# Patient Record
Sex: Female | Born: 1965 | Race: White | Hispanic: No | Marital: Married
Health system: Southern US, Community
[De-identification: ages and names within clinical notes are randomized; demographics above are authoritative.]

## PROBLEM LIST (undated history)

## (undated) DIAGNOSIS — K219 Gastro-esophageal reflux disease without esophagitis: Secondary | ICD-10-CM

## (undated) DIAGNOSIS — R748 Abnormal levels of other serum enzymes: Secondary | ICD-10-CM

## (undated) DIAGNOSIS — R7401 Elevation of levels of liver transaminase levels: Secondary | ICD-10-CM

## (undated) DIAGNOSIS — R740 Nonspecific elevation of levels of transaminase and lactic acid dehydrogenase [LDH]: Secondary | ICD-10-CM

## (undated) DIAGNOSIS — Z720 Tobacco use: Secondary | ICD-10-CM

## (undated) DIAGNOSIS — E876 Hypokalemia: Secondary | ICD-10-CM

## (undated) DIAGNOSIS — R0600 Dyspnea, unspecified: Secondary | ICD-10-CM

## (undated) DIAGNOSIS — E871 Hypo-osmolality and hyponatremia: Secondary | ICD-10-CM

## (undated) DIAGNOSIS — F1010 Alcohol abuse, uncomplicated: Secondary | ICD-10-CM

## (undated) DIAGNOSIS — R569 Unspecified convulsions: Secondary | ICD-10-CM

---

## 2017-09-07 ENCOUNTER — Inpatient Hospital Stay (HOSPITAL_COMMUNITY)
Admission: AD | Admit: 2017-09-07 | Discharge: 2017-09-09 | DRG: 101 | Disposition: A | Discharge status: Home / Self Care | Payer: BLUE CROSS/BLUE SHIELD | Source: Other Acute Inpatient Hospital | Attending: Internal Medicine | Admitting: Internal Medicine

## 2017-09-07 ENCOUNTER — Observation Stay (HOSPITAL_COMMUNITY): Payer: BLUE CROSS/BLUE SHIELD

## 2017-09-07 ENCOUNTER — Encounter (HOSPITAL_COMMUNITY): Payer: Self-pay | Admitting: Internal Medicine

## 2017-09-07 DIAGNOSIS — F1020 Alcohol dependence, uncomplicated: Secondary | ICD-10-CM | POA: Diagnosis present

## 2017-09-07 DIAGNOSIS — K7010 Alcoholic hepatitis without ascites: Secondary | ICD-10-CM | POA: Diagnosis present

## 2017-09-07 DIAGNOSIS — Z7951 Long term (current) use of inhaled steroids: Secondary | ICD-10-CM

## 2017-09-07 DIAGNOSIS — Z720 Tobacco use: Secondary | ICD-10-CM | POA: Diagnosis present

## 2017-09-07 DIAGNOSIS — M6282 Rhabdomyolysis: Secondary | ICD-10-CM | POA: Diagnosis present

## 2017-09-07 DIAGNOSIS — Z882 Allergy status to sulfonamides status: Secondary | ICD-10-CM

## 2017-09-07 DIAGNOSIS — R740 Nonspecific elevation of levels of transaminase and lactic acid dehydrogenase [LDH]: Secondary | ICD-10-CM

## 2017-09-07 DIAGNOSIS — E871 Hypo-osmolality and hyponatremia: Secondary | ICD-10-CM | POA: Diagnosis present

## 2017-09-07 DIAGNOSIS — F1010 Alcohol abuse, uncomplicated: Secondary | ICD-10-CM | POA: Diagnosis present

## 2017-09-07 DIAGNOSIS — F329 Major depressive disorder, single episode, unspecified: Secondary | ICD-10-CM | POA: Diagnosis present

## 2017-09-07 DIAGNOSIS — F17200 Nicotine dependence, unspecified, uncomplicated: Secondary | ICD-10-CM | POA: Diagnosis present

## 2017-09-07 DIAGNOSIS — E876 Hypokalemia: Secondary | ICD-10-CM | POA: Diagnosis present

## 2017-09-07 DIAGNOSIS — R0600 Dyspnea, unspecified: Secondary | ICD-10-CM | POA: Diagnosis present

## 2017-09-07 DIAGNOSIS — Y901 Blood alcohol level of 20-39 mg/100 ml: Secondary | ICD-10-CM | POA: Diagnosis present

## 2017-09-07 DIAGNOSIS — R569 Unspecified convulsions: Secondary | ICD-10-CM | POA: Diagnosis not present

## 2017-09-07 DIAGNOSIS — K219 Gastro-esophageal reflux disease without esophagitis: Secondary | ICD-10-CM | POA: Diagnosis present

## 2017-09-07 DIAGNOSIS — R748 Abnormal levels of other serum enzymes: Secondary | ICD-10-CM | POA: Diagnosis present

## 2017-09-07 DIAGNOSIS — R7401 Elevation of levels of liver transaminase levels: Secondary | ICD-10-CM | POA: Diagnosis present

## 2017-09-07 DIAGNOSIS — J45901 Unspecified asthma with (acute) exacerbation: Secondary | ICD-10-CM

## 2017-09-07 DIAGNOSIS — E872 Acidosis: Secondary | ICD-10-CM | POA: Diagnosis present

## 2017-09-07 HISTORY — DX: Gastro-esophageal reflux disease without esophagitis: K21.9

## 2017-09-07 HISTORY — DX: Elevation of levels of liver transaminase levels: R74.01

## 2017-09-07 HISTORY — DX: Tobacco use: Z72.0

## 2017-09-07 HISTORY — DX: Unspecified convulsions: R56.9

## 2017-09-07 HISTORY — DX: Nonspecific elevation of levels of transaminase and lactic acid dehydrogenase (ldh): R74.0

## 2017-09-07 HISTORY — DX: Alcohol abuse, uncomplicated: F10.10

## 2017-09-07 HISTORY — DX: Dyspnea, unspecified: R06.00

## 2017-09-07 HISTORY — DX: Hypokalemia: E87.6

## 2017-09-07 HISTORY — DX: Abnormal levels of other serum enzymes: R74.8

## 2017-09-07 HISTORY — DX: Hypo-osmolality and hyponatremia: E87.1

## 2017-09-07 LAB — COMPREHENSIVE METABOLIC PANEL
ALBUMIN: 2.6 g/dL — AB (ref 3.5–5.0)
ALK PHOS: 151 U/L — AB (ref 38–126)
ALT: 33 U/L (ref 14–54)
AST: 73 U/L — AB (ref 15–41)
Anion gap: 12 (ref 5–15)
BILIRUBIN TOTAL: 2.9 mg/dL — AB (ref 0.3–1.2)
CALCIUM: 7.9 mg/dL — AB (ref 8.9–10.3)
CO2: 25 mmol/L (ref 22–32)
CREATININE: 0.81 mg/dL (ref 0.44–1.00)
Chloride: 103 mmol/L (ref 101–111)
GFR calc Af Amer: >60 mL/min (ref >60)
GLUCOSE: 93 mg/dL (ref 65–99)
POTASSIUM: 2.8 mmol/L — AB (ref 3.5–5.1)
Sodium: 140 mmol/L (ref 135–145)
TOTAL PROTEIN: 4.7 g/dL — AB (ref 6.5–8.1)

## 2017-09-07 LAB — MAGNESIUM: MAGNESIUM: 2 mg/dL (ref 1.7–2.4)

## 2017-09-07 LAB — CBC
HCT: 34.4 % — ABNORMAL LOW (ref 36.0–46.0)
HEMOGLOBIN: 11.6 g/dL — AB (ref 12.0–15.0)
MCH: 35.9 pg — AB (ref 26.0–34.0)
MCHC: 33.7 g/dL (ref 30.0–36.0)
MCV: 106.5 fL — ABNORMAL HIGH (ref 78.0–100.0)
Platelets: 235 10*3/uL (ref 150–400)
RBC: 3.23 MIL/uL — ABNORMAL LOW (ref 3.87–5.11)
RDW: 15.7 % — ABNORMAL HIGH (ref 11.5–15.5)
WBC: 9.2 10*3/uL (ref 4.0–10.5)

## 2017-09-07 LAB — ETHANOL

## 2017-09-07 LAB — GLUCOSE, CAPILLARY: Glucose-Capillary: 105 mg/dL — ABNORMAL HIGH (ref 65–99)

## 2017-09-07 LAB — CK: Total CK: 419 U/L — ABNORMAL HIGH (ref 38–234)

## 2017-09-07 MED ORDER — IPRATROPIUM-ALBUTEROL 0.5-2.5 (3) MG/3ML IN SOLN
3.0000 mL | RESPIRATORY_TRACT | Status: AC
  Administered 2017-09-07 (×3): 3 mL via RESPIRATORY_TRACT
  Filled 2017-09-07 (×5): qty 3

## 2017-09-07 MED ORDER — LORAZEPAM 1 MG PO TABS: 1.0000 mg | ORAL_TABLET | Freq: Four times a day (QID) | ORAL | Status: DC | PRN

## 2017-09-07 MED ORDER — ADULT MULTIVITAMIN W/MINERALS CH
1.0000 | ORAL_TABLET | Freq: Every day | ORAL | Status: DC
  Administered 2017-09-07 – 2017-09-09 (×3): 1 via ORAL
  Filled 2017-09-07 (×3): qty 1

## 2017-09-07 MED ORDER — ACETAMINOPHEN 325 MG PO TABS
650.0000 mg | ORAL_TABLET | Freq: Four times a day (QID) | ORAL | Status: DC | PRN
  Administered 2017-09-07 – 2017-09-09 (×2): 650 mg via ORAL
  Filled 2017-09-07 (×2): qty 2

## 2017-09-07 MED ORDER — HYDROCODONE-ACETAMINOPHEN 5-325 MG PO TABS: 1.0000 | ORAL_TABLET | ORAL | Status: DC | PRN

## 2017-09-07 MED ORDER — LORAZEPAM 2 MG/ML IJ SOLN: 1.0000 mg | Freq: Four times a day (QID) | INTRAMUSCULAR | Status: DC | PRN

## 2017-09-07 MED ORDER — FOLIC ACID 1 MG PO TABS
1.0000 mg | ORAL_TABLET | Freq: Every day | ORAL | Status: DC
  Administered 2017-09-07 – 2017-09-09 (×3): 1 mg via ORAL
  Filled 2017-09-07 (×3): qty 1

## 2017-09-07 MED ORDER — VITAMIN B-1 100 MG PO TABS
100.0000 mg | ORAL_TABLET | Freq: Every day | ORAL | Status: DC
  Administered 2017-09-07 – 2017-09-09 (×3): 100 mg via ORAL
  Filled 2017-09-07 (×3): qty 1

## 2017-09-07 MED ORDER — SODIUM CHLORIDE 0.9 % IV SOLN
INTRAVENOUS | Status: AC
  Administered 2017-09-07 (×2): via INTRAVENOUS

## 2017-09-07 MED ORDER — SENNOSIDES-DOCUSATE SODIUM 8.6-50 MG PO TABS: 1.0000 | ORAL_TABLET | Freq: Every evening | ORAL | Status: DC | PRN

## 2017-09-07 MED ORDER — ACETAMINOPHEN 650 MG RE SUPP: 650.0000 mg | Freq: Four times a day (QID) | RECTAL | Status: DC | PRN

## 2017-09-07 MED ORDER — CITALOPRAM HYDROBROMIDE 40 MG PO TABS
40.0000 mg | ORAL_TABLET | Freq: Every day | ORAL | Status: DC
  Administered 2017-09-07 – 2017-09-09 (×3): 40 mg via ORAL
  Filled 2017-09-07 (×2): qty 1

## 2017-09-07 MED ORDER — POTASSIUM CHLORIDE CRYS ER 20 MEQ PO TBCR
40.0000 meq | EXTENDED_RELEASE_TABLET | Freq: Two times a day (BID) | ORAL | Status: AC
  Administered 2017-09-07 (×2): 40 meq via ORAL
  Filled 2017-09-07 (×2): qty 2

## 2017-09-07 MED ORDER — LIDOCAINE VISCOUS 2 % MT SOLN
15.0000 mL | Freq: Four times a day (QID) | OROMUCOSAL | Status: DC | PRN
  Filled 2017-09-07: qty 15

## 2017-09-07 MED ORDER — LEVETIRACETAM 500 MG PO TABS
500.0000 mg | ORAL_TABLET | Freq: Two times a day (BID) | ORAL | Status: DC
  Administered 2017-09-07 – 2017-09-09 (×5): 500 mg via ORAL
  Filled 2017-09-07 (×5): qty 1

## 2017-09-07 MED ORDER — LORAZEPAM 2 MG/ML IJ SOLN: 1.0000 mg | INTRAMUSCULAR | Status: DC | PRN

## 2017-09-07 MED ORDER — GUAIFENESIN ER 600 MG PO TB12
600.0000 mg | ORAL_TABLET | Freq: Two times a day (BID) | ORAL | Status: DC
  Administered 2017-09-07 – 2017-09-09 (×5): 600 mg via ORAL
  Filled 2017-09-07 (×5): qty 1

## 2017-09-07 MED ORDER — ONDANSETRON HCL 4 MG/2ML IJ SOLN: 4.0000 mg | Freq: Four times a day (QID) | INTRAMUSCULAR | Status: DC | PRN

## 2017-09-07 MED ORDER — TRAZODONE HCL 50 MG PO TABS: 25.0000 mg | ORAL_TABLET | Freq: Every evening | ORAL | Status: DC | PRN

## 2017-09-07 MED ORDER — THIAMINE HCL 100 MG/ML IJ SOLN
100.0000 mg | Freq: Every day | INTRAMUSCULAR | Status: DC
  Filled 2017-09-07: qty 2

## 2017-09-07 MED ORDER — ENOXAPARIN SODIUM 40 MG/0.4ML ~~LOC~~ SOLN
40.0000 mg | Freq: Every day | SUBCUTANEOUS | Status: DC
  Administered 2017-09-07 – 2017-09-09 (×3): 40 mg via SUBCUTANEOUS
  Filled 2017-09-07 (×3): qty 0.4

## 2017-09-07 MED ORDER — ONDANSETRON HCL 4 MG PO TABS: 4.0000 mg | ORAL_TABLET | Freq: Four times a day (QID) | ORAL | Status: DC | PRN

## 2017-09-07 NOTE — Progress Notes (Addendum)
Pt resting did not want to eat dinner d/t touch pain. Asked pt if she wanted something softer to eat or drink pt refused at this time. Pt very sleepy but arousable.

## 2017-09-07 NOTE — Progress Notes (Signed)
EEG at bedside.

## 2017-09-07 NOTE — Progress Notes (Signed)
Spoke with pts sister per pt request. Updates given, emergency contact numbers placed in chart. Pt resting did not want to speak with sister at this time.

## 2017-09-07 NOTE — Progress Notes (Signed)
Received report from transferring facility ER RN  Pollyann KennedySteve Pack that,  [facility].

## 2017-09-07 NOTE — Procedures (Signed)
History: 52 year old female being evaluated for new onset seizures  Sedation: None  Technique: This is a 21 channel routine scalp EEG performed at the bedside with bipolar and monopolar montages arranged in accordance to the international 10/20 system of electrode placement. One channel was dedicated to EKG recording.    Background: The background consists of intermixed alpha and beta activities. There is a well defined posterior dominant rhythm of 12 Hz that attenuates with eye opening. Sleep is recorded with normal appearing structures.  There are positive occipital sharp transients of sleep (POSTS) which are a normal variant.  Photic stimulation: Physiologic driving is not performed  EEG Abnormalities: None  Clinical Interpretation: This normal EEG is recorded in the waking and sleep. state. There was no seizure or seizure predisposition recorded on this study. Please note that a normal EEG does not preclude the possibility of epilepsy.   Theresa SlotMcNeill Devaris Quirk, MD [facility] [phone]  If 7pm- 7am, please page neurology on call as listed in AMION.

## 2017-09-07 NOTE — H&P (Signed)
History and Physical    Theresa Gray PPI:[id number] DOB: [birth year] DOA: 09/07/2017  PCP: Joyice Faster, FNP Patient coming from: home  Chief Complaint: seizure  HPI: Theresa Gray is a 52 y.o. female with medical history significant gerd, etoh abuse, tobacco use presents to [facility] room 18 from [place] complaint of seizures. She'll evaluation reveals hypokalemia hyponatremia  elevated transaminase elevated CK. [facility] are asked to admit  Formation is obtained from the patient noting information may be unreliable as she remains somewhat lethargic. Reportedly she [place] friends [place] the [place] as she used to work [place] and she had a grand mal seizure lasted 1 minute. Chart review indicates afterwards she was alert and oriented. No report of any loss of bladder function. Patient reports prior to that event she had to "brief episodes where could not talk". EMS was called she was transported to [facility] in [place].Chart review indicates once she got to emergency department she experienced two episodes of seizure activity without regaining consciousness. Chart review indicates at that time airway managed with NRB mask and she received ativan. She was then postictal.  She denies any recent illness. 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 Denies any fever chills nausea vomiting. She denies headache dizziness syncope or near-syncope. He denies dysuria hematuria frequency or urgency.    ED Course: In the ED a dental she is provided with thousand milligrams of Keppra and Ativan. Upon admission she is afebrile hemodynamically stable and not hypoxic.  Review of Systems: As per HPI otherwise all other systems reviewed and are negative.   Ambulatory Status: Ambulates independently is independent with ADLs  Past Medical History:  Diagnosis Date  . Dyspnea   . Elevated CK     . Elevated transaminase level   . ETOH abuse   . GERD (gastroesophageal reflux disease)   . Hypokalemia   . Hyponatremia   . Seizure ([place])   . Tobacco use       Social History   Socioeconomic History  . Marital status: Married    Spouse name: Not on file  . Number of children: Not on file  . Years of education: Not on file  . Highest education level: Not on file  Social Needs  . Financial resource strain: Not on file  . Food insecurity - worry: Not on file  . Food insecurity - inability: Not on file  . Transportation needs - medical: Not on file  . Transportation needs - non-medical: Not on file  Occupational History  . Not on file  Tobacco Use  . Smoking status: Not on file  Substance and Sexual Activity  . Alcohol use: Not on file  . Drug use: Not on file  . Sexual activity: Not on file  Other Topics Concern  . Not on file  Social History Narrative  . Not on file    Allergies  Allergen Reactions  . Sulfa Antibiotics     No family history on file.  Prior to Admission medications   Not on File    Physical Exam: Vitals:   09/07/17 0815  BP: 116/77  Pulse: 77  Resp: 20  Temp: 98.2 F (36.8 C)  TempSrc: Oral  SpO2: 99%     General:  Appears lethargic arousable to verbal stimuli much older than her stated age in no acute distress Eyes:  PERRL, EOMI, normal lids,  iris ENT:  grossly normal hearing, dried blood noted inside of lower lip. Right side of tongue was swelling erythema Neck:  no LAD, masses or thyromegaly Cardiovascular:  RRR, no m/r/g. No LE edema.  Respiratory:  Increased work of breathing breath sounds quite coarse throughout all of her wheeze frequent cough during exam Abdomen:  soft, ntnd, positive bowel sounds throughout no guarding or rebounding Skin:  no rash or induration seen on limited exam Musculoskeletal:  grossly normal tone BUE/BLE, good ROM, no bony abnormality Psychiatric:  grossly normal mood and affect, speech fluent and  appropriate, AOx3 Neurologic:  CN 2-12 grossly intact, moves all extremities in coordinated fashion, sensation intact lethargic oriented to self and place follow simple commands moving all extremities spontaneously   Labs on Admission: I have personally reviewed following labs and imaging studies  CBC: No results for input(s): WBC, NEUTROABS, HGB, HCT, MCV, PLT in the last 168 hours. Basic Metabolic Panel: No results for input(s): NA, K, CL, CO2, GLUCOSE, BUN, CREATININE, CALCIUM, MG, PHOS in the last 168 hours. GFR: CrCl cannot be calculated (No order found.). Liver Function Tests: No results for input(s): AST, ALT, ALKPHOS, BILITOT, PROT, ALBUMIN in the last 168 hours. No results for input(s): LIPASE, AMYLASE in the last 168 hours. No results for input(s): AMMONIA in the last 168 hours. Coagulation Profile: No results for input(s): INR, PROTIME in the last 168 hours. Cardiac Enzymes: No results for input(s): CKTOTAL, CKMB, CKMBINDEX, TROPONINI in the last 168 hours. BNP (last 3 results) No results for input(s): PROBNP in the last 8760 hours. HbA1C: No results for input(s): HGBA1C in the last 72 hours. CBG: Recent Labs  Lab 09/07/17 0816  GLUCAP 105*   Lipid Profile: No results for input(s): CHOL, HDL, LDLCALC, TRIG, CHOLHDL, LDLDIRECT in the last 72 hours. Thyroid Function Tests: No results for input(s): TSH, T4TOTAL, FREET4, T3FREE, THYROIDAB in the last 72 hours. Anemia Panel: No results for input(s): VITAMINB12, FOLATE, FERRITIN, TIBC, IRON, RETICCTPCT in the last 72 hours. Urine analysis: No results found for: COLORURINE, APPEARANCEUR, LABSPEC, PHURINE, GLUCOSEU, HGBUR, BILIRUBINUR, KETONESUR, PROTEINUR, UROBILINOGEN, NITRITE, LEUKOCYTESUR  Creatinine Clearance: CrCl cannot be calculated (No order found.).  Sepsis Labs: @LABRCNTIP (procalcitonin:4,lacticidven:4) )No results found for this or any previous visit (from the past 240 hour(s)).   Radiological Exams on  Admission: No results found.  EKG:   Assessment/Plan Principal Problem:   Seizure (HCC) Active Problems:   GERD (gastroesophageal reflux disease)   Tobacco use   Elevated transaminase level   Hypokalemia   Hyponatremia   Elevated CK   ETOH abuse   Dyspnea   1.Seizure. No history of same. Etiology uncertain but concern for EtOH withdrawal she indicates she was not consuming as much alcohol as she normally does. CT head at [facility] negative. UDS unremarkable. ETOH level 0.030 -Admit to telemetry -EEG -CBC, CMET, CK -Continue Keppra -When necessary Ativan -Evaluate home medications once home meds determined by pharmacy -Seizure precautions  #2.dyspnea/wheezing. Likely COPD as she is a smoker. Her home medications do include an inhaler. She continues to smoke. Is not on home oxygen. She is unsure if she's ever had pulmonary function test. -Scheduled nebulizers -chest xray as some concern for aspiration of blood during seizure -anti-tussive -oxygen supplemenatation as indicated -monitor her oxygen saturation level -May benefit from outpatient pulmonary function tests  #3.elevated transaminase. Alk phos 208, ast 78, total bili 1.6 Likely related to EtOH abuse. She reports she drinks 1 40oz hard cider daily. etoh level in ED 0.030. -  iv fluids -cmet -cessation counseling -OP follow up  #4. EtOH abuse. See above -CIWA protocol  #5. Elevated CK. Likely related to #1. Creatinine within the limits of normal. She received 1071m NS in [place] -IV fluids -recheck  #6. GERD. Appears stable at baseline -PPI  #7. Hypokalemia/hyponatremia/metabolic acidosis. Related to above.  -iv fluids -replete -recheck    DVT prophylaxis: lovenox  Code Status: full  Family Communication: none present  Disposition Plan: home  Consults called: none  Admission status: obs    BDyanne CarrelM MD [facility]  If 7PM-7AM, please contact night-coverage [email or web address] Password  [email or web address]  09/07/2017, 9:05 AM

## 2017-09-07 NOTE — Progress Notes (Signed)
EEG completed, results pending. 

## 2017-09-08 DIAGNOSIS — R569 Unspecified convulsions: Secondary | ICD-10-CM | POA: Diagnosis not present

## 2017-09-08 DIAGNOSIS — J45901 Unspecified asthma with (acute) exacerbation: Secondary | ICD-10-CM

## 2017-09-08 LAB — CBC
HEMATOCRIT: 32.5 % — AB (ref 36.0–46.0)
Hemoglobin: 10.8 g/dL — ABNORMAL LOW (ref 12.0–15.0)
MCH: 36.7 pg — AB (ref 26.0–34.0)
MCHC: 33.2 g/dL (ref 30.0–36.0)
MCV: 110.5 fL — AB (ref 78.0–100.0)
Platelets: 197 10*3/uL (ref 150–400)
RBC: 2.94 MIL/uL — ABNORMAL LOW (ref 3.87–5.11)
RDW: 16.3 % — AB (ref 11.5–15.5)
WBC: 8.2 10*3/uL (ref 4.0–10.5)

## 2017-09-08 LAB — COMPREHENSIVE METABOLIC PANEL
ALBUMIN: 2.3 g/dL — AB (ref 3.5–5.0)
ALT: 28 U/L (ref 14–54)
AST: 51 U/L — AB (ref 15–41)
Alkaline Phosphatase: 137 U/L — ABNORMAL HIGH (ref 38–126)
Anion gap: 10 (ref 5–15)
CHLORIDE: 110 mmol/L (ref 101–111)
CO2: 23 mmol/L (ref 22–32)
Calcium: 7.9 mg/dL — ABNORMAL LOW (ref 8.9–10.3)
Creatinine, Ser: 0.75 mg/dL (ref 0.44–1.00)
GFR calc Af Amer: >60 mL/min (ref >60)
GLUCOSE: 86 mg/dL (ref 65–99)
POTASSIUM: 3.2 mmol/L — AB (ref 3.5–5.1)
SODIUM: 143 mmol/L (ref 135–145)
Total Bilirubin: 2 mg/dL — ABNORMAL HIGH (ref 0.3–1.2)
Total Protein: 4.4 g/dL — ABNORMAL LOW (ref 6.5–8.1)

## 2017-09-08 LAB — HIV ANTIBODY (ROUTINE TESTING W REFLEX): HIV Screen 4th Generation wRfx: NONREACTIVE

## 2017-09-08 LAB — MAGNESIUM: Magnesium: 1.9 mg/dL (ref 1.7–2.4)

## 2017-09-08 LAB — CK: Total CK: 252 U/L — ABNORMAL HIGH (ref 38–234)

## 2017-09-08 MED ORDER — PREDNISONE 20 MG PO TABS
40.0000 mg | ORAL_TABLET | Freq: Every day | ORAL | Status: DC
  Administered 2017-09-08 – 2017-09-09 (×2): 40 mg via ORAL
  Filled 2017-09-08 (×2): qty 2

## 2017-09-08 MED ORDER — IPRATROPIUM-ALBUTEROL 0.5-2.5 (3) MG/3ML IN SOLN
3.0000 mL | Freq: Four times a day (QID) | RESPIRATORY_TRACT | Status: DC
  Administered 2017-09-08: 3 mL via RESPIRATORY_TRACT
  Filled 2017-09-08 (×2): qty 3

## 2017-09-08 MED ORDER — IPRATROPIUM-ALBUTEROL 0.5-2.5 (3) MG/3ML IN SOLN
3.0000 mL | Freq: Three times a day (TID) | RESPIRATORY_TRACT | Status: DC
  Administered 2017-09-08 – 2017-09-09 (×2): 3 mL via RESPIRATORY_TRACT
  Filled 2017-09-08 (×2): qty 3

## 2017-09-08 MED ORDER — LOPERAMIDE HCL 2 MG PO CAPS
2.0000 mg | ORAL_CAPSULE | ORAL | Status: DC | PRN
  Administered 2017-09-08: 2 mg via ORAL
  Filled 2017-09-08: qty 1

## 2017-09-08 NOTE — Progress Notes (Signed)
PROGRESS NOTE  Theresa Gray MAU:[id number] DOB: May 05, 1966 DOA: 09/07/2017 PCP: Joyice Faster, FNP  HPI/Recap of past 24 hours:  Theresa Gray is a 52 y.o. year old female with medical history significant for asthma, history of alcohol dependence who presented on 09/07/2017 with witnessed seizures as a transfer from [place] and was found to have transaminitis, hyperbilirubinemia, hypokalemia, rhabdomyolysis.    Interval History Patient reports she thinks she had 2 seizures last night as she did have some fecal incontinence.  Also reports that she has had a worsening cough for several weeks.  Denies any fevers or chills.  Denies any other infectious symptoms.  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 Patient was previously on prednisone a few weeks ago unclear how long for presumed case of gout on left foot.  Assessment/Plan: Principal Problem:   Seizure ([place]) Active Problems:   GERD (gastroesophageal reflux disease)   Tobacco use   Elevated transaminase level   Hypokalemia   Hyponatremia   Elevated CK   ETOH abuse   Dyspnea   #New onset seizures, witnessed -Normal EEG, no hypoglycemia, UDS unremarkable, ethanol level 0.030, CT [facility] at [facility] negative - Infection thought less likely, chest x-ray unremarkable, HIV negative -No arrhythmias on telemetry - Keppra, continue seizure precautions, PRN Ativan, monitor on telemetry  #Rhabdomyolysis in the setting of seizure, improving -Responded to fluid resuscitation -Continue to encourage oral hydration -Downtrending from [phone]  #Hypokalemia, improving -Continue repletion (currently 3.2), continue to monitor on BMP, check mag  #Asthma, concern for exacerbation -Patient chronic smoker, worsening cough, increased wheezing and rhonchorous on exam -No current oxygen requirements, chest x-ray shows no acute abnormalities -Scheduled duo nebs, prednisone 40 mg x 5 days  #Transaminitis  and hyperbilirubinemia, improving -Current AST 51, ALT 28, alk phos 137, bili improvement 2-2.9 - AST 2x value of ALT query 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 -Continue to monitor  #Alcohol dependence States history withdrawal, but denies any prior seizures, last hospitalization in April 2018 - Reports longer drinking "hard liquor", only drinks hard Ciders -CIWA protocol  #Depression -Home Celexa  #GERD -Home PPI  Code Status: Full  Family Communication: No family at bedside  Disposition Plan: Breathing treatment for asthma exacerbation, continue fluids for rhabdo monitor for alcohol withdrawal   Consultants:  None  Procedures:  None  Antimicrobials:  None  Cultures:  None  Telemetry: Yes  DVT prophylaxis: Lovenox   Objective: Vitals:   09/07/17 2026 09/08/17 0000 09/08/17 0415 09/08/17 0747  BP: (!) 104/55 (!) 104/58 (!) 111/59 119/63  Pulse: 88 66 62 71  Resp: 20 20 20 20   Temp: 100.1 F (37.8 C) 99.6 F (37.6 C) 99.4 F (37.4 C) 99.4 F (37.4 C)  TempSrc: Oral Oral Oral Oral  SpO2: 98% 97% 95% 96%    Intake/Output Summary (Last 24 hours) at 09/08/2017 0802 Last data filed at 09/07/2017 1741 Gross per 24 hour  Intake 583.33 ml  Output -  Net 583.33 ml   There were no vitals filed for this visit.  Exam:  General: Comfortably lying in bed Eyes: EOMI, anicteric ENT: Oral Mucosa clear and moist Neck: normal, no thyromegaly Cardiovascular: regular rate and rhythm, no murmurs, rubs or gallops,  Respiratory: Normal respiratory effort on room air, and expiratory wheezing, diffuse rhonchi, Abdomen: soft, non-distended, non-tender, normal bowel sounds Skin: Some erythema on left toes but no localized skin changes, slightly tender to palpation, mild swelling in the left foot Musculoskeletal:Good  ROM, no contractures. Normal muscle tone Neurologic: Grossly no focal neuro deficit.Mental status AAOx3 Psychiatric:Appropriate affect, and  mood  Data Reviewed: CBC: Recent Labs  Lab 09/07/17 0906 09/08/17 0534  WBC 9.2 8.2  HGB 11.6* 10.8*  HCT 34.4* 32.5*  MCV 106.5* 110.5*  PLT 235 299   Basic Metabolic Panel: Recent Labs  Lab 09/07/17 0906 09/08/17 0534  NA 140 143  K 2.8* 3.2*  CL 103 110  CO2 25 23  GLUCOSE 93 86  BUN <5* <5*  CREATININE 0.81 0.75  CALCIUM 7.9* 7.9*  MG 2.0  --    GFR: CrCl cannot be calculated (Unknown ideal weight.). Liver Function Tests: Recent Labs  Lab 09/07/17 0906 09/08/17 0534  AST 73* 51*  ALT 33 28  ALKPHOS 151* 137*  BILITOT 2.9* 2.0*  PROT 4.7* 4.4*  ALBUMIN 2.6* 2.3*   No results for input(s): LIPASE, AMYLASE in the last 168 hours. No results for input(s): AMMONIA in the last 168 hours. Coagulation Profile: No results for input(s): INR, PROTIME in the last 168 hours. Cardiac Enzymes: Recent Labs  Lab 09/07/17 0906 09/08/17 0534  CKTOTAL 419* 252*   BNP (last 3 results) No results for input(s): PROBNP in the last 8760 hours. HbA1C: No results for input(s): HGBA1C in the last 72 hours. CBG: Recent Labs  Lab 09/07/17 0816  GLUCAP 105*   Lipid Profile: No results for input(s): CHOL, HDL, LDLCALC, TRIG, CHOLHDL, LDLDIRECT in the last 72 hours. Thyroid Function Tests: No results for input(s): TSH, T4TOTAL, FREET4, T3FREE, THYROIDAB in the last 72 hours. Anemia Panel: No results for input(s): VITAMINB12, FOLATE, FERRITIN, TIBC, IRON, RETICCTPCT in the last 72 hours. Urine analysis: No results found for: COLORURINE, APPEARANCEUR, LABSPEC, PHURINE, GLUCOSEU, HGBUR, BILIRUBINUR, KETONESUR, PROTEINUR, UROBILINOGEN, NITRITE, LEUKOCYTESUR Sepsis Labs: @LABRCNTIP (procalcitonin:4,lacticidven:4)  )No results found for this or any previous visit (from the past 240 hour(s)).    Studies: Dg Chest Port 1 View  Result Date: 09/07/2017 CLINICAL DATA:  Shortness of breath EXAM: PORTABLE CHEST 1 VIEW COMPARISON:  None. FINDINGS: The heart size and mediastinal  contours are within normal limits. Both lungs are clear. The visualized skeletal structures are unremarkable. IMPRESSION: No active disease. Electronically Signed   By: Dorise Bullion III M.D   On: 09/07/2017 09:25    Scheduled Meds: . citalopram  40 mg Oral Daily  . enoxaparin (LOVENOX) injection  40 mg Subcutaneous Daily  . folic acid  1 mg Oral Daily  . guaiFENesin  600 mg Oral BID  . levETIRAcetam  500 mg Oral BID  . multivitamin with minerals  1 tablet Oral Daily  . thiamine  100 mg Oral Daily   Or  . thiamine  100 mg Intravenous Daily    Continuous Infusions: . sodium chloride 125 mL/hr at 09/07/17 2257     LOS: 0 days     Desiree Hane, MD [facility] Pager [phone]  If 7PM-7AM, please contact night-coverage [email or web address] Password [email or web address] 09/08/2017, 8:02 AM

## 2017-09-09 ENCOUNTER — Encounter (HOSPITAL_COMMUNITY): Payer: Self-pay | Admitting: Radiology

## 2017-09-09 ENCOUNTER — Inpatient Hospital Stay (HOSPITAL_COMMUNITY): Payer: BLUE CROSS/BLUE SHIELD

## 2017-09-09 DIAGNOSIS — E871 Hypo-osmolality and hyponatremia: Secondary | ICD-10-CM | POA: Diagnosis present

## 2017-09-09 DIAGNOSIS — K7010 Alcoholic hepatitis without ascites: Secondary | ICD-10-CM | POA: Diagnosis present

## 2017-09-09 DIAGNOSIS — F17200 Nicotine dependence, unspecified, uncomplicated: Secondary | ICD-10-CM | POA: Diagnosis present

## 2017-09-09 DIAGNOSIS — E876 Hypokalemia: Secondary | ICD-10-CM | POA: Diagnosis present

## 2017-09-09 DIAGNOSIS — M6282 Rhabdomyolysis: Secondary | ICD-10-CM | POA: Diagnosis present

## 2017-09-09 DIAGNOSIS — K219 Gastro-esophageal reflux disease without esophagitis: Secondary | ICD-10-CM | POA: Diagnosis present

## 2017-09-09 DIAGNOSIS — Y901 Blood alcohol level of 20-39 mg/100 ml: Secondary | ICD-10-CM | POA: Diagnosis present

## 2017-09-09 DIAGNOSIS — Z882 Allergy status to sulfonamides status: Secondary | ICD-10-CM | POA: Diagnosis not present

## 2017-09-09 DIAGNOSIS — R569 Unspecified convulsions: Secondary | ICD-10-CM | POA: Diagnosis present

## 2017-09-09 DIAGNOSIS — F1020 Alcohol dependence, uncomplicated: Secondary | ICD-10-CM | POA: Diagnosis present

## 2017-09-09 DIAGNOSIS — E872 Acidosis: Secondary | ICD-10-CM | POA: Diagnosis present

## 2017-09-09 DIAGNOSIS — F329 Major depressive disorder, single episode, unspecified: Secondary | ICD-10-CM | POA: Diagnosis present

## 2017-09-09 DIAGNOSIS — Z7951 Long term (current) use of inhaled steroids: Secondary | ICD-10-CM | POA: Diagnosis not present

## 2017-09-09 DIAGNOSIS — J45901 Unspecified asthma with (acute) exacerbation: Secondary | ICD-10-CM | POA: Diagnosis present

## 2017-09-09 LAB — BASIC METABOLIC PANEL
Anion gap: 8 (ref 5–15)
CO2: 23 mmol/L (ref 22–32)
Calcium: 8 mg/dL — ABNORMAL LOW (ref 8.9–10.3)
Chloride: 111 mmol/L (ref 101–111)
Creatinine, Ser: 0.75 mg/dL (ref 0.44–1.00)
GFR calc Af Amer: >60 mL/min (ref >60)
GLUCOSE: 110 mg/dL — AB (ref 65–99)
POTASSIUM: 3.2 mmol/L — AB (ref 3.5–5.1)
Sodium: 142 mmol/L (ref 135–145)

## 2017-09-09 LAB — CBC
HEMATOCRIT: 31.4 % — AB (ref 36.0–46.0)
Hemoglobin: 10.4 g/dL — ABNORMAL LOW (ref 12.0–15.0)
MCH: 36.5 pg — ABNORMAL HIGH (ref 26.0–34.0)
MCHC: 33.1 g/dL (ref 30.0–36.0)
MCV: 110.2 fL — AB (ref 78.0–100.0)
PLATELETS: 179 10*3/uL (ref 150–400)
RBC: 2.85 MIL/uL — ABNORMAL LOW (ref 3.87–5.11)
RDW: 15.6 % — ABNORMAL HIGH (ref 11.5–15.5)
WBC: 12.1 10*3/uL — ABNORMAL HIGH (ref 4.0–10.5)

## 2017-09-09 MED ORDER — ALBUTEROL SULFATE (2.5 MG/3ML) 0.083% IN NEBU: 3.0000 mL | INHALATION_SOLUTION | RESPIRATORY_TRACT | Status: DC | PRN

## 2017-09-09 MED ORDER — GUAIFENESIN ER 600 MG PO TB12: 600.0000 mg | ORAL_TABLET | Freq: Two times a day (BID) | ORAL | 0 refills | Status: AC

## 2017-09-09 MED ORDER — GADOBENATE DIMEGLUMINE 529 MG/ML IV SOLN
15.0000 mL | Freq: Once | INTRAVENOUS | Status: AC | PRN
  Administered 2017-09-09: 13 mL via INTRAVENOUS

## 2017-09-09 MED ORDER — ADULT MULTIVITAMIN W/MINERALS CH: 1.0000 | ORAL_TABLET | Freq: Every day | ORAL | 0 refills | Status: AC

## 2017-09-09 MED ORDER — FOLIC ACID 1 MG PO TABS: 1.0000 mg | ORAL_TABLET | Freq: Every day | ORAL | 0 refills | Status: AC

## 2017-09-09 MED ORDER — MOMETASONE FURO-FORMOTEROL FUM 200-5 MCG/ACT IN AERO
2.0000 | INHALATION_SPRAY | Freq: Two times a day (BID) | RESPIRATORY_TRACT | Status: DC
  Filled 2017-09-09: qty 8.8

## 2017-09-09 MED ORDER — THIAMINE HCL 100 MG PO TABS: 100.0000 mg | ORAL_TABLET | Freq: Every day | ORAL | 0 refills | Status: AC

## 2017-09-09 MED ORDER — PREDNISONE 20 MG PO TABS: 40.0000 mg | ORAL_TABLET | Freq: Every day | ORAL | 0 refills | Status: AC

## 2017-09-09 NOTE — Progress Notes (Signed)
Pt being discharged from hospital per orders from MD. Pt educated on discharge instructions. Pt verbalized understanding of instructions. All questions and concerns were addressed. Pt's IV was removed prior to discharge. Pt exited hospital via wheelchair accompanied by staff. 

## 2017-09-09 NOTE — Discharge Summary (Signed)
Discharge Summary  Theresa Gray WJX:[id number] DOB: 10-06-1965  PCP: Altamease Oiler, FNP  Admit date: 09/07/2017 Discharge date: 09/09/2017  Time spent: < 25 minutes  Admitted From: Home Disposition: Home  Recommendations for Outpatient Follow-up:  1. Follow up with PCP in 1-2 weeks.  BMP (potassium, LFTs, bilirubin) check. 2. Continue on prednisone for 3 further days (to complete 5 total days of therapy)   Discharge Diagnoses:  Active Hospital Problems   Diagnosis Date Noted  . Seizure (HCC)   . Asthma, chronic, unspecified asthma severity, with acute exacerbation 09/08/2017  . GERD (gastroesophageal reflux disease)   . Tobacco use   . Elevated transaminase level   . Hypokalemia   . Hyponatremia   . Elevated CK   . ETOH abuse   . Dyspnea     Resolved Hospital Problems  No resolved problems to display.    Discharge Condition: Stable  CODE STATUS: Full Diet recommendation: Heart Healthy    Vitals:   09/09/17 0958 09/09/17 1244  BP: 133/74 (!) 139/97  Pulse: 71 63  Resp: 20 16  Temp: 98.3 F (36.8 C) 98.5 F (36.9 C)  SpO2: 92% 93%    History of present illness:  Theresa Gray is a 52 y.o. year old female with medical history significant for asthma, history of alcohol dependence who presented on 09/07/2017 with witnessed seizures as a transfer from [facility] and was found to have transaminitis, hyperbilirubinemia, hypokalemia, rhabdomyolysis and concern for new onset seizure.     Hospital Course:  Principal Problem:   Seizure [facility]) Active Problems:   GERD (gastroesophageal reflux disease)   Tobacco use   Elevated transaminase level   Hypokalemia   Hyponatremia   Elevated CK   ETOH abuse   Dyspnea   Asthma, chronic, unspecified asthma severity, with acute exacerbation   # #New onset seizures, witnessed -Normal EEG, no hypoglycemia, UDS unremarkable, ethanol level 0.030, CT head at [facility] negative -2 witnessed seizures in the ED,patient was  loaded with Keppra and started on maintenance therapy at outside hospital prior to transfer to [facility] -MRI brain here showed no acute intracranial findings.  Did show premature age atrophy. -Factors workup negative: Chest x-ray unremarkable, HIV negative -No arrhythmias on telemetry -Found to be hypocalcemia however with corrected low albumin, calcium was found to be normal - During 24-hour interval at [facility] patient without any witnessed seizure activity -Did not trigger CIWA protocol, showed no signs or symptoms consistent with alcohol withdrawal,Not likely related to alcohol induced seizure Plan -Given no focal abnormalities on MRI we will discontinue Keppra on discharge (after discussion with neurology)  -emphasized importance of PCP follow-up in 1-2 weeks.  #Rhabdomyolysis, in setting of seizure, improved CK downtrending from 419 to 252 with IV fluid resuscitation followed by oral hydration prior to discharge. -May benefit from follow-up CK on PCP appointment  #Hypokalemia, improved Nadir of 2.8 on admission.  Given oral potassium replacement.  With improvement to 3.2.  Magnesium 1.9.  On discussion with neurology hyperkalemia not thought to be potential risk factor procedure.  #Asthma, increased wheezing without oxygen requirement Improvement in cough and wheezing with scheduled duo nebs for 24 hours -Transition to as needed inhaler, daily Advair on discharge, will continue for 3 further days of oral prednisone (completed 2 days of prednisone therapy while in hospital)  #Transaminitis and hyperbilirubinemia, improving AST slightly elevated at 7351 with supportive care.  Patient without abdominal pain.  Bilirubin 2.9 on admission down trended to 2 prior to discharge -  AST 25 ALT, most likely related to alcoholic hepatitis which improved with supportive care  #Alcohol dependence History of withdrawal, last hospitalization in April 2018.  Patient reported only drinking "hard  cider". -Patient was monitored on CIWA protocol, patient did not require any Ativan.  She remained hemodynamically stable without any signs or symptoms of active withdrawal  #Depression and GERD Patient will continue her home dose of Celexa and PPI  Antibiotics: None  Microbiology: None None Consultations:  None   Procedures/Studies: Mr Laqueta Jean Wo Contrast  Result Date: 09/09/2017 CLINICAL DATA:  Witnessed seizure, alcohol dependence. Transaminitis. EXAM: MRI HEAD WITHOUT AND WITH CONTRAST TECHNIQUE: Multiplanar, multiecho pulse sequences of the brain and surrounding structures were obtained without and with intravenous contrast. CONTRAST:  13mL MULTIHANCE GADOBENATE DIMEGLUMINE 529 MG/ML IV SOLN COMPARISON:  None. FINDINGS: The patient was unable to remain motionless for the exam. Small or subtle lesions could be overlooked. Brain: No evidence for acute infarction, hemorrhage, mass lesion, hydrocephalus, or extra-axial fluid. Premature for age cerebral and cerebellar atrophy. Only minor periventricular greater than subcortical white matter signal abnormality, likely small vessel disease. Thin-section coronal T2 weighted images through the temporal lobes were performed, demonstrating no mass or inflammatory process. Post infusion, no abnormal enhancement of the brain or meninges. Vascular: Flow voids are maintained throughout the carotid, basilar, and vertebral arteries. There are no areas of chronic hemorrhage. Skull and upper cervical spine: Normal marrow signal. Sinuses/Orbits: Near complete filling of the LEFT maxillary sinus with T2 hyperintense material, without significant mucosal enhancement, likely retention cyst. Mucosal thickening involves the RIGHT maxillary sinus and RIGHT division of the sphenoid sinus. No layering fluid. Unremarkable orbits. Other: None. IMPRESSION: Premature for age atrophy. No acute intracranial findings. No abnormal postcontrast enhancement. Electronically Signed    By: Elsie Stain M.D.   On: 09/09/2017 13:08   Dg Chest Port 1 View  Result Date: 09/07/2017 CLINICAL DATA:  Shortness of breath EXAM: PORTABLE CHEST 1 VIEW COMPARISON:  None. FINDINGS: The heart size and mediastinal contours are within normal limits. Both lungs are clear. The visualized skeletal structures are unremarkable. IMPRESSION: No active disease. Electronically Signed   By: Gerome Sam III M.D   On: 09/07/2017 09:25     Discharge Exam: BP (!) 139/97 (BP Location: Left Arm) Comment: RN Notified  Pulse 63   Temp 98.5 F (36.9 C) (Oral)   Resp 16   SpO2 93%   General: Lying in bed, no apparent distress Eyes: EOMI, anicteric ENT: Oral Mucosa clear and moist Cardiovascular: regular rate and rhythm, no murmurs, rubs or gallops, slight pitting edema of left foot, improved from prior exams  Respiratory: Normal respiratory effort on room air, lungs clear to auscultation bilaterally Abdomen: soft, non-distended, non-tender, normal bowel sounds Skin: No Rash Musculoskeletal:Good ROM, no contractures. Normal muscle tone Neurologic: Grossly no focal neuro deficit.Mental status AAOx4 Psychiatric:Appropriate affect, and mood   Discharge Instructions You were cared for by a hospitalist during your hospital stay. If you have any questions about your discharge medications or the care you received while you were in the hospital after you are discharged, you can call the unit and asked to speak with the hospitalist on call if the hospitalist that took care of you is not available. Once you are discharged, your primary care physician will handle any further medical issues. Please note that NO REFILLS for any discharge medications will be authorized once you are discharged, as it is imperative that you return to your primary care  physician (or establish a relationship with a primary care physician if you do not have one) for your aftercare needs so that they can reassess your need for  medications and monitor your lab values.  Discharge Instructions    Diet - low sodium heart healthy   Complete by:  As directed    Increase activity slowly   Complete by:  As directed      Allergies as of 09/09/2017      Reactions   Sulfa Antibiotics Anaphylaxis      Medication List    TAKE these medications   citalopram 40 MG tablet Commonly known as:  CELEXA Take 40 mg by mouth daily.   Fluticasone-Salmeterol 250-50 MCG/DOSE Aepb Commonly known as:  ADVAIR Inhale 1 puff into the lungs 2 (two) times daily.   folic acid 1 MG tablet Commonly known as:  FOLVITE Take 1 tablet (1 mg total) by mouth daily. Start taking on:  09/10/2017   guaiFENesin 600 MG 12 hr tablet Commonly known as:  MUCINEX Take 1 tablet (600 mg total) by mouth 2 (two) times daily for 5 days.   multivitamin with minerals Tabs tablet Take 1 tablet by mouth daily. Start taking on:  09/10/2017   omeprazole 20 MG capsule Commonly known as:  PRILOSEC Take 20 mg by mouth daily.   predniSONE 20 MG tablet Commonly known as:  DELTASONE Take 2 tablets (40 mg total) by mouth daily with breakfast. Start taking on:  09/10/2017   PROAIR HFA 108 (90 Base) MCG/ACT inhaler Generic drug:  albuterol Inhale 1-2 puffs into the lungs every 4 (four) hours as needed for shortness of breath.   ranitidine 150 MG capsule Commonly known as:  ZANTAC Take 150 mg by mouth 2 (two) times daily.   thiamine 100 MG tablet Take 1 tablet (100 mg total) by mouth daily. Start taking on:  09/10/2017      Allergies  Allergen Reactions  . Sulfa Antibiotics Anaphylaxis      The results of significant diagnostics from this hospitalization (including imaging, microbiology, ancillary and laboratory) are listed below for reference.    Significant Diagnostic Studies: Mr Laqueta Jean ZO Contrast  Result Date: 09/09/2017 CLINICAL DATA:  Witnessed seizure, alcohol dependence. Transaminitis. EXAM: MRI HEAD WITHOUT AND WITH CONTRAST  TECHNIQUE: Multiplanar, multiecho pulse sequences of the brain and surrounding structures were obtained without and with intravenous contrast. CONTRAST:  13mL MULTIHANCE GADOBENATE DIMEGLUMINE 529 MG/ML IV SOLN COMPARISON:  None. FINDINGS: The patient was unable to remain motionless for the exam. Small or subtle lesions could be overlooked. Brain: No evidence for acute infarction, hemorrhage, mass lesion, hydrocephalus, or extra-axial fluid. Premature for age cerebral and cerebellar atrophy. Only minor periventricular greater than subcortical white matter signal abnormality, likely small vessel disease. Thin-section coronal T2 weighted images through the temporal lobes were performed, demonstrating no mass or inflammatory process. Post infusion, no abnormal enhancement of the brain or meninges. Vascular: Flow voids are maintained throughout the carotid, basilar, and vertebral arteries. There are no areas of chronic hemorrhage. Skull and upper cervical spine: Normal marrow signal. Sinuses/Orbits: Near complete filling of the LEFT maxillary sinus with T2 hyperintense material, without significant mucosal enhancement, likely retention cyst. Mucosal thickening involves the RIGHT maxillary sinus and RIGHT division of the sphenoid sinus. No layering fluid. Unremarkable orbits. Other: None. IMPRESSION: Premature for age atrophy. No acute intracranial findings. No abnormal postcontrast enhancement. Electronically Signed   By: Elsie Stain M.D.   On: 09/09/2017 13:08   Dg  Chest Port 1 View  Result Date: 09/07/2017 CLINICAL DATA:  Shortness of breath EXAM: PORTABLE CHEST 1 VIEW COMPARISON:  None. FINDINGS: The heart size and mediastinal contours are within normal limits. Both lungs are clear. The visualized skeletal structures are unremarkable. IMPRESSION: No active disease. Electronically Signed   By: Gerome Samavid  Williams III M.D   On: 09/07/2017 09:25    Microbiology: No results found for this or any previous visit (from  the past 240 hour(s)).   Labs: Basic Metabolic Panel: Recent Labs  Lab 09/07/17 0906 09/08/17 0534 09/08/17 0850 09/09/17 0948  NA 140 143  --  142  K 2.8* 3.2*  --  3.2*  CL 103 110  --  111  CO2 25 23  --  23  GLUCOSE 93 86  --  110*  BUN <5* <5*  --  <5*  CREATININE 0.81 0.75  --  0.75  CALCIUM 7.9* 7.9*  --  8.0*  MG 2.0  --  1.9  --    Liver Function Tests: Recent Labs  Lab 09/07/17 0906 09/08/17 0534  AST 73* 51*  ALT 33 28  ALKPHOS 151* 137*  BILITOT 2.9* 2.0*  PROT 4.7* 4.4*  ALBUMIN 2.6* 2.3*   No results for input(s): LIPASE, AMYLASE in the last 168 hours. No results for input(s): AMMONIA in the last 168 hours. CBC: Recent Labs  Lab 09/07/17 0906 09/08/17 0534 09/09/17 0948  WBC 9.2 8.2 12.1*  HGB 11.6* 10.8* 10.4*  HCT 34.4* 32.5* 31.4*  MCV 106.5* 110.5* 110.2*  PLT 235 197 179   Cardiac Enzymes: Recent Labs  Lab 09/07/17 0906 09/08/17 0534  CKTOTAL 419* 252*   BNP: BNP (last 3 results) No results for input(s): BNP in the last 8760 hours.  ProBNP (last 3 results) No results for input(s): PROBNP in the last 8760 hours.  CBG: Recent Labs  Lab 09/07/17 0816  GLUCAP 105*       Signed:  Laverna PeaceShayla D Tesneem Dufrane, MD [facility] 09/09/2017, 3:12 PM

## 2017-09-09 NOTE — Care Management Note (Signed)
Case Management Note  Patient Details  Name: Legrand RamsRhonda Kassabian MRN: [id number] Date of Birth: 29-May-1966  Subjective/Objective:                    Action/Plan: Pt discharging home with self care. Pt has PCP, insurance and transportation home. CM signing off.   Expected Discharge Date:  09/09/17               Expected Discharge Plan:  Home/Self Care  In-House Referral:     Discharge planning Services     Post Acute Care Choice:    Choice offered to:     DME Arranged:    DME Agency:     HH Arranged:    HH Agency:     Status of Service:  Completed, signed off  If discussed at [employer] Length of Stay Meetings, dates discussed:    Additional Comments:  Kermit BaloKelli F Soo Steelman, RN 09/09/2017, 1:51 PM

## 2017-09-09 NOTE — Care Management Note (Signed)
Case Management Note  Patient Details  Name: Theresa Gray MRN: Legrand [id number] Date of Birth: [birth year]  Subjective/Objective:     Pt admitted with seizures. She is from home and [facility].              Action/Plan: Plan is for her to return home when medically stable. CM following for d/c needs, physician orders.   Expected Discharge Date:                  Expected Discharge Plan:  Home/Self Care  In-House Referral:     Discharge planning Services     Post Acute Care Choice:    Choice offered to:     DME Arranged:    DME Agency:     HH Arranged:    HH Agency:     Status of Service:  In process, will continue to follow  If discussed at Long Length of Stay Meetings, dates discussed:    Additional Comments:  Kermit BaloKelli F Nyeem Stoke, RN 09/09/2017, 1:06 PM

## 2018-11-05 IMAGING — MR MR HEAD WO/W CM
11 of 13 series · 33 of 48 positions shown · IV contrast (multihance)
Comparison: None.

CLINICAL DATA: Witnessed seizure, alcohol dependence.
Transaminitis.

EXAM:
MRI HEAD WITHOUT AND WITH CONTRAST
TECHNIQUE: Multiplanar, multiecho pulse sequences of the brain and surrounding
structures were obtained without and with intravenous contrast.
CONTRAST:  13mL MULTIHANCE GADOBENATE DIMEGLUMINE 529 MG/ML IV SOLN

[Series 3: DWI · axial · 3.0mm · 0.94mm/px · z∈[-67,+89]mm · 9 of 106 slices shown (1 of 2)]
[im 1/106]
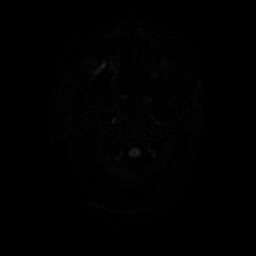
[im 14/106]
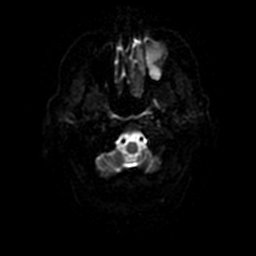
[im 27/106]
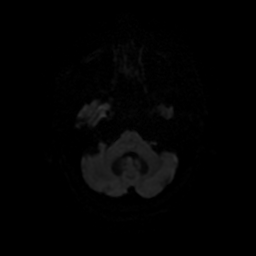
[im 40/106]
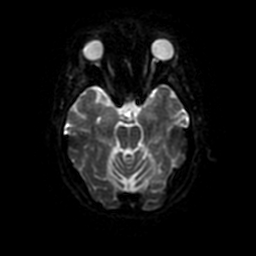
[im 53/106]
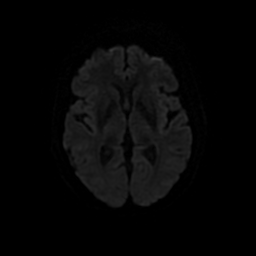
[im 66/106]
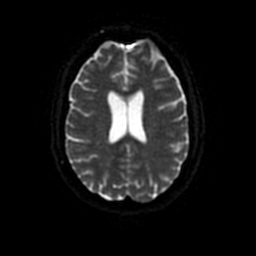
[im 79/106]
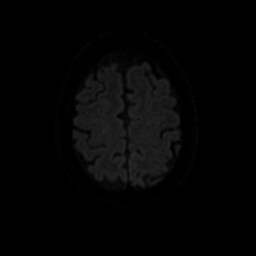
[im 92/106]
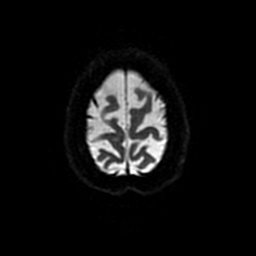
[im 106/106]
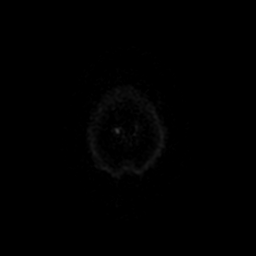

[Series 5: DWI · coronal · 5.0mm · 1.09mm/px · 5 of 66 slices shown (2 of 2)]
[im 1/66]
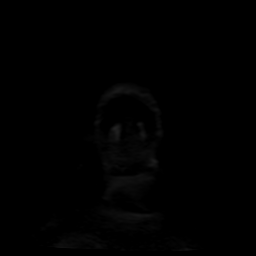
[im 17/66]
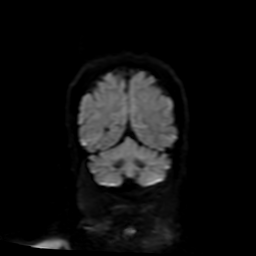
[im 33/66]
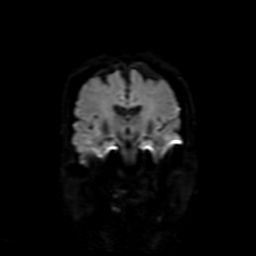
[im 49/66]
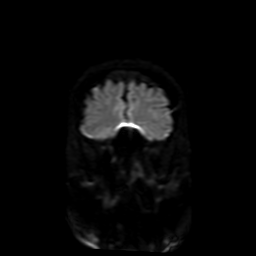
[im 66/66]
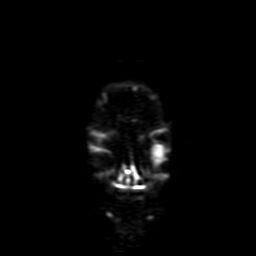

[Series 6: FLAIR · sagittal · 5.0mm · 0.47mm/px · 2 of 25 slices shown (1 of 2)]
[im 1/25]
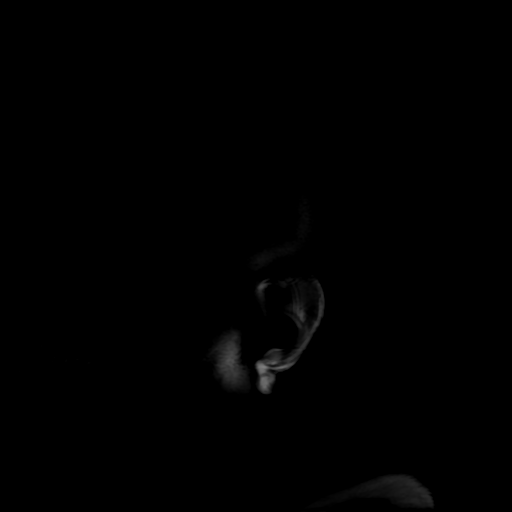
[im 25/25]
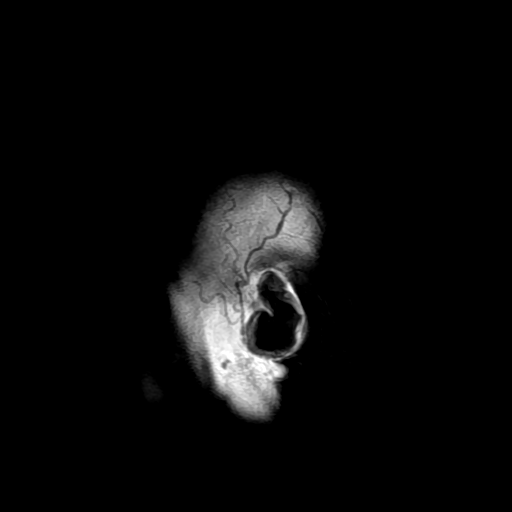

[Series 7: T2 · axial · 5.0mm · 0.47mm/px · z∈[-74,+88]mm · 2 of 28 slices shown (1 of 3)]
[im 1/28]
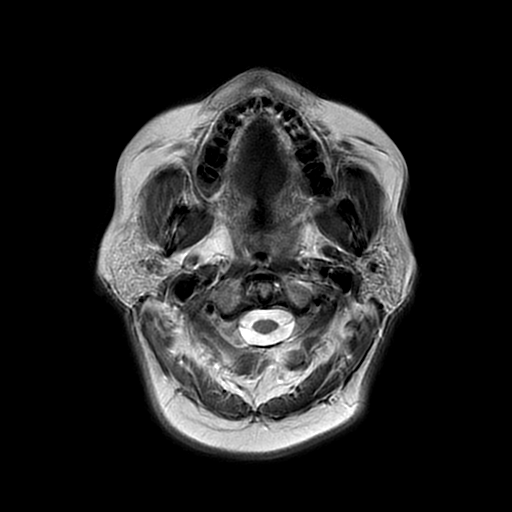
[im 28/28]
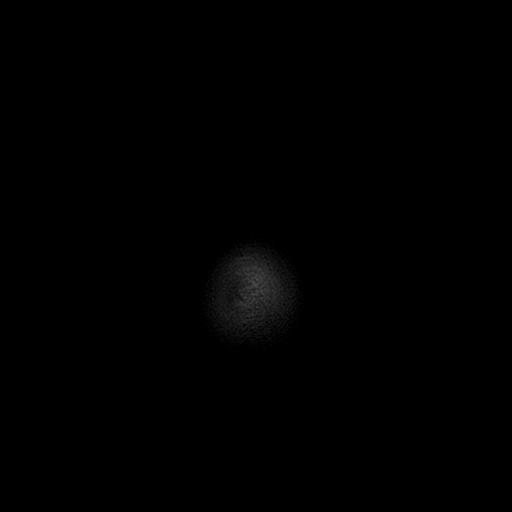

[Series 8: FLAIR · axial · 3.0mm · 0.47mm/px · z∈[-74,+88]mm · 2 of 28 slices shown (2 of 2)]
[im 1/28]
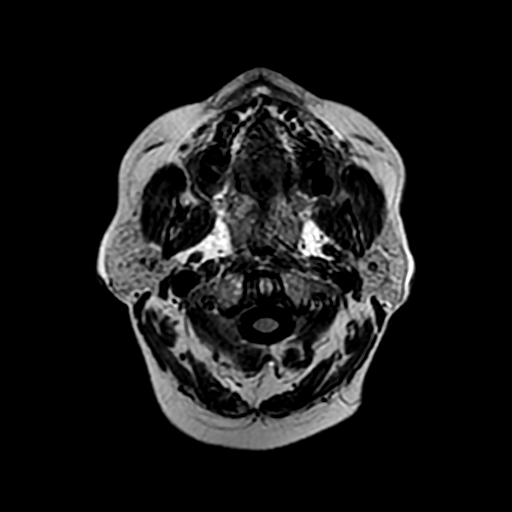
[im 28/28]
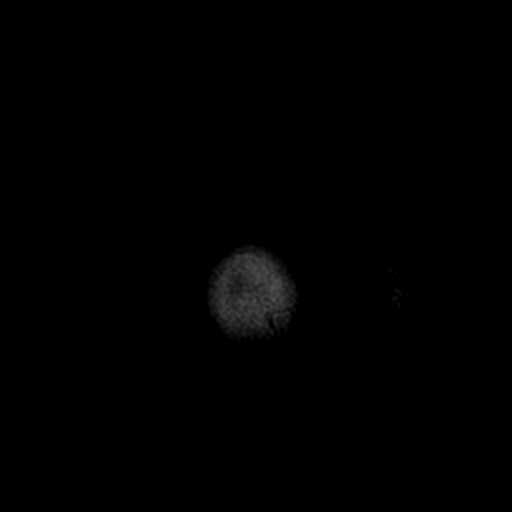

[Series 9: (person_name) · axial · 3.0mm · 0.47mm/px · 1 of 100 slices shown]
[im 1/100]
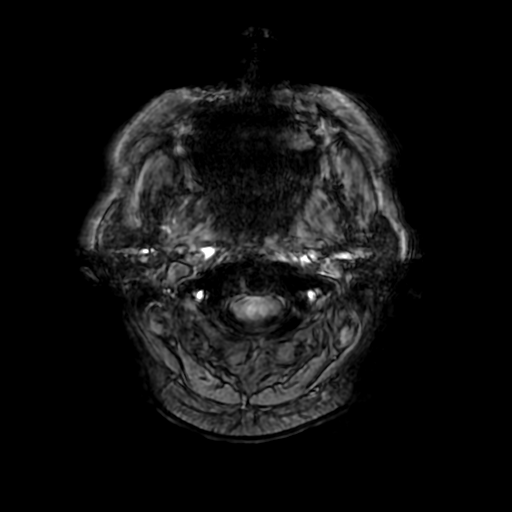

[Series 11: T2 · coronal · 3.5mm · 0.35mm/px · 2 of 27 slices shown (2 of 3)]
[im 1/27]
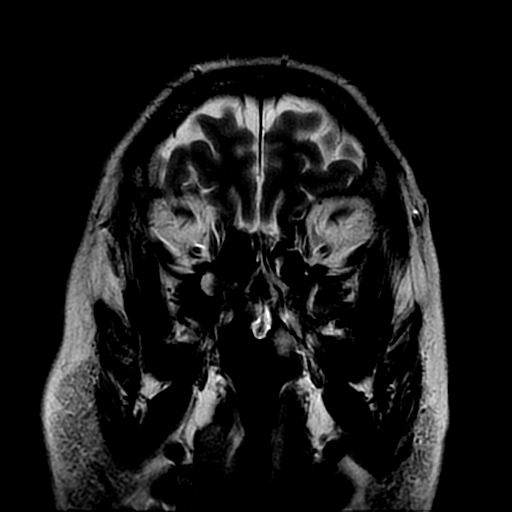
[im 27/27]
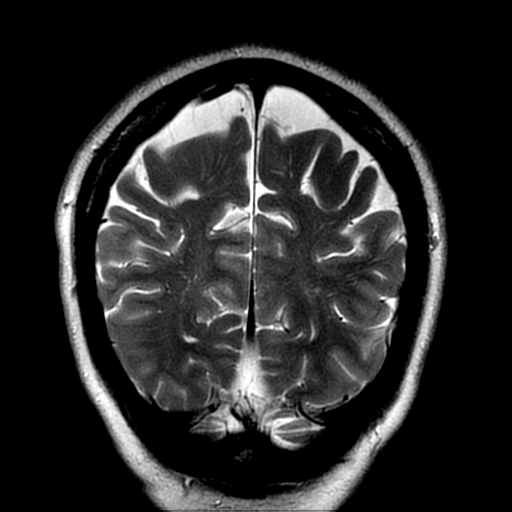

[Series 12: T2 · coronal · 5.0mm · 0.47mm/px · 2 of 28 slices shown (3 of 3)]
[im 1/28]
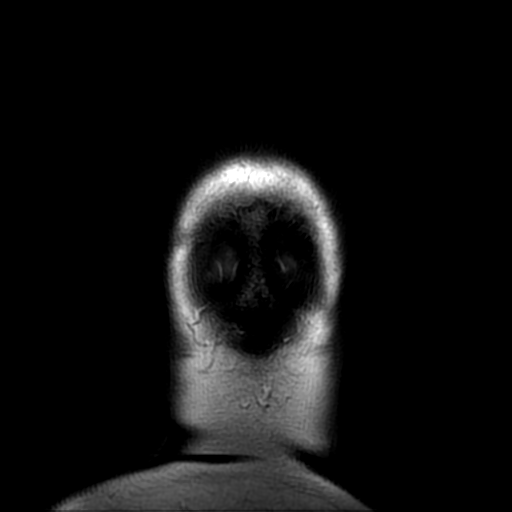
[im 28/28]
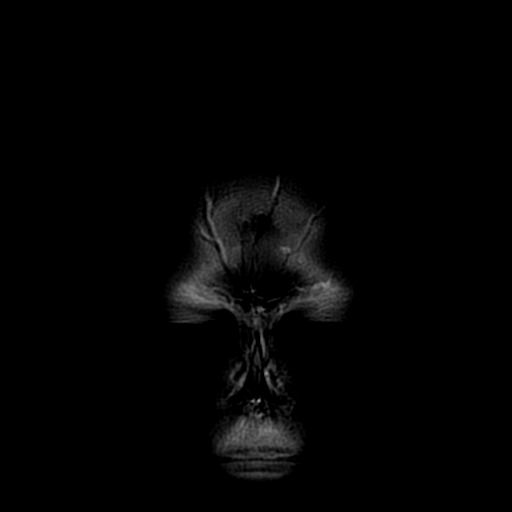

[Series 14: T1 · coronal · 5.0mm · 0.47mm/px · 2 of 28 slices shown]
[im 1/28]
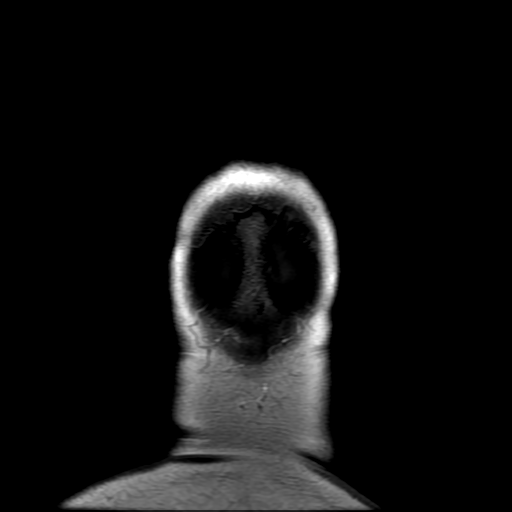
[im 28/28]
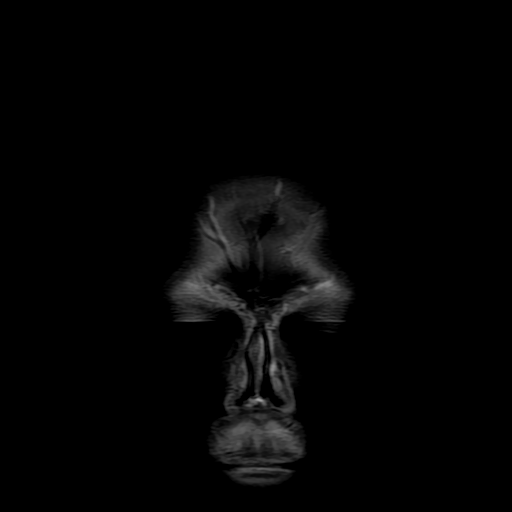

[Series 350: ADC · axial · 3.0mm · 0.94mm/px · z∈[-67,+89]mm · 4 of 53 slices shown (1 of 2)]
[im 1/53]
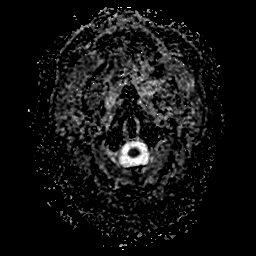
[im 18/53]
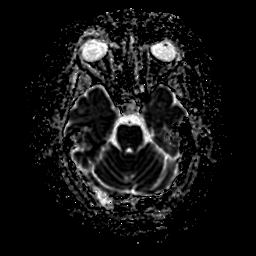
[im 35/53]
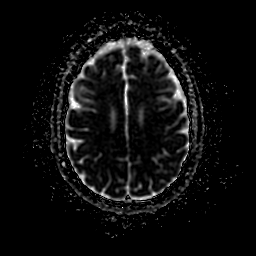
[im 53/53]
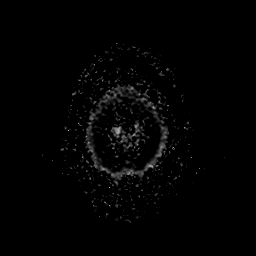

[Series 550: ADC · coronal · 5.0mm · 1.09mm/px · 2 of 33 slices shown (2 of 2)]
[im 1/33]
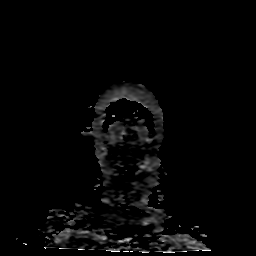
[im 33/33]
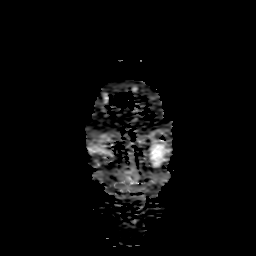

[33 of 48 positions shown; findings below may reference images not displayed]

FINDINGS: The patient was unable to remain motionless for the exam. Small or
subtle lesions could be overlooked.

Brain: No evidence for acute infarction, hemorrhage, mass lesion,
hydrocephalus, or extra-axial fluid. Premature for age cerebral and
cerebellar atrophy. Only minor periventricular greater than
subcortical white matter signal abnormality, likely small vessel
disease.

Thin-section coronal T2 weighted images through the temporal lobes
were performed, demonstrating no mass or inflammatory process.

Post infusion, no abnormal enhancement of the brain or meninges.

Vascular: Flow voids are maintained throughout the carotid, basilar,
and vertebral arteries. There are no areas of chronic hemorrhage.

Skull and upper cervical spine: Normal marrow signal.

Sinuses/Orbits: Near complete filling of the LEFT maxillary sinus
with T2 hyperintense material, without significant mucosal
enhancement, likely retention cyst. Mucosal thickening involves the
RIGHT maxillary sinus and RIGHT division of the sphenoid sinus. No
layering fluid. Unremarkable orbits.

Other: None.
IMPRESSION: Premature for age atrophy. No acute intracranial findings. No
abnormal postcontrast enhancement.
